# Patient Record
Sex: Female | Born: 1975 | Race: White | Hispanic: No | Marital: Married | Smoking: Never smoker
Health system: Southern US, Community
[De-identification: ages and names within clinical notes are randomized; demographics above are authoritative.]

## PROBLEM LIST (undated history)

## (undated) DIAGNOSIS — K529 Noninfective gastroenteritis and colitis, unspecified: Secondary | ICD-10-CM

## (undated) DIAGNOSIS — J45909 Unspecified asthma, uncomplicated: Secondary | ICD-10-CM

## (undated) DIAGNOSIS — J302 Other seasonal allergic rhinitis: Secondary | ICD-10-CM

## (undated) DIAGNOSIS — N83209 Unspecified ovarian cyst, unspecified side: Secondary | ICD-10-CM

## (undated) HISTORY — PX: UMBILICAL HERNIA REPAIR: SHX196

## (undated) HISTORY — DX: Noninfective gastroenteritis and colitis, unspecified: K52.9

## (undated) HISTORY — DX: Other seasonal allergic rhinitis: J30.2

## (undated) HISTORY — DX: Unspecified asthma, uncomplicated: J45.909

## (undated) HISTORY — DX: Unspecified ovarian cyst, unspecified side: N83.209

---

## 1999-07-22 ENCOUNTER — Other Ambulatory Visit: Admission: RE | Admit: 1999-07-22 | Discharge: 1999-07-22 | Payer: Self-pay | Admitting: *Deleted

## 2000-08-22 ENCOUNTER — Other Ambulatory Visit: Admission: RE | Admit: 2000-08-22 | Discharge: 2000-08-22 | Payer: Self-pay | Admitting: *Deleted

## 2001-08-15 ENCOUNTER — Other Ambulatory Visit: Admission: RE | Admit: 2001-08-15 | Discharge: 2001-08-15 | Payer: Self-pay | Admitting: Obstetrics and Gynecology

## 2003-01-05 ENCOUNTER — Other Ambulatory Visit: Admission: RE | Admit: 2003-01-05 | Discharge: 2003-01-05 | Payer: Self-pay | Admitting: Obstetrics and Gynecology

## 2003-10-11 ENCOUNTER — Emergency Department (HOSPITAL_COMMUNITY): Admission: AD | Admit: 2003-10-11 | Discharge: 2003-10-11 | Payer: Self-pay | Admitting: Family Medicine

## 2004-01-29 ENCOUNTER — Other Ambulatory Visit: Admission: RE | Admit: 2004-01-29 | Discharge: 2004-01-29 | Payer: Self-pay | Admitting: Family Medicine

## 2005-02-25 ENCOUNTER — Inpatient Hospital Stay (HOSPITAL_COMMUNITY): Admission: AD | Admit: 2005-02-25 | Discharge: 2005-02-27 | Payer: Self-pay | Admitting: Obstetrics and Gynecology

## 2005-03-03 ENCOUNTER — Encounter: Admission: RE | Admit: 2005-03-03 | Discharge: 2005-03-14 | Payer: Self-pay | Admitting: Obstetrics and Gynecology

## 2005-03-24 ENCOUNTER — Other Ambulatory Visit: Admission: RE | Admit: 2005-03-24 | Discharge: 2005-03-24 | Payer: Self-pay | Admitting: Obstetrics and Gynecology

## 2005-08-18 ENCOUNTER — Encounter: Admission: RE | Admit: 2005-08-18 | Discharge: 2005-08-18 | Payer: Self-pay | Admitting: Family Medicine

## 2005-10-15 ENCOUNTER — Emergency Department (HOSPITAL_COMMUNITY): Admission: EM | Admit: 2005-10-15 | Discharge: 2005-10-15 | Payer: Self-pay | Admitting: Family Medicine

## 2005-10-17 ENCOUNTER — Encounter: Admission: RE | Admit: 2005-10-17 | Discharge: 2005-10-17 | Payer: Self-pay | Admitting: Family Medicine

## 2006-01-29 ENCOUNTER — Ambulatory Visit: Payer: Self-pay | Admitting: Family Medicine

## 2006-03-14 ENCOUNTER — Ambulatory Visit: Payer: Self-pay | Admitting: Family Medicine

## 2006-04-24 ENCOUNTER — Ambulatory Visit: Payer: Self-pay | Admitting: Family Medicine

## 2006-09-03 ENCOUNTER — Ambulatory Visit: Payer: Self-pay | Admitting: Family Medicine

## 2007-02-26 ENCOUNTER — Inpatient Hospital Stay (HOSPITAL_COMMUNITY): Admission: AD | Admit: 2007-02-26 | Discharge: 2007-02-28 | Payer: Self-pay | Admitting: Obstetrics and Gynecology

## 2007-06-19 IMAGING — CR DG CHEST 2V
2 series · 2 of 2 positions shown · non-contrast
Comparison: none

CLINICAL DATA: Cough and fever. 
 CHEST, TWO VIEWS: 
 Comparison 10/11/03. 
 There is an infiltrate in the superior segment of the right lower lobe with some extension into the lower segment of the right lower lobe.  This is compatible with pneumonia.  There is no effusion.  The left lung is clear.

[view not recorded (1 of 2)]
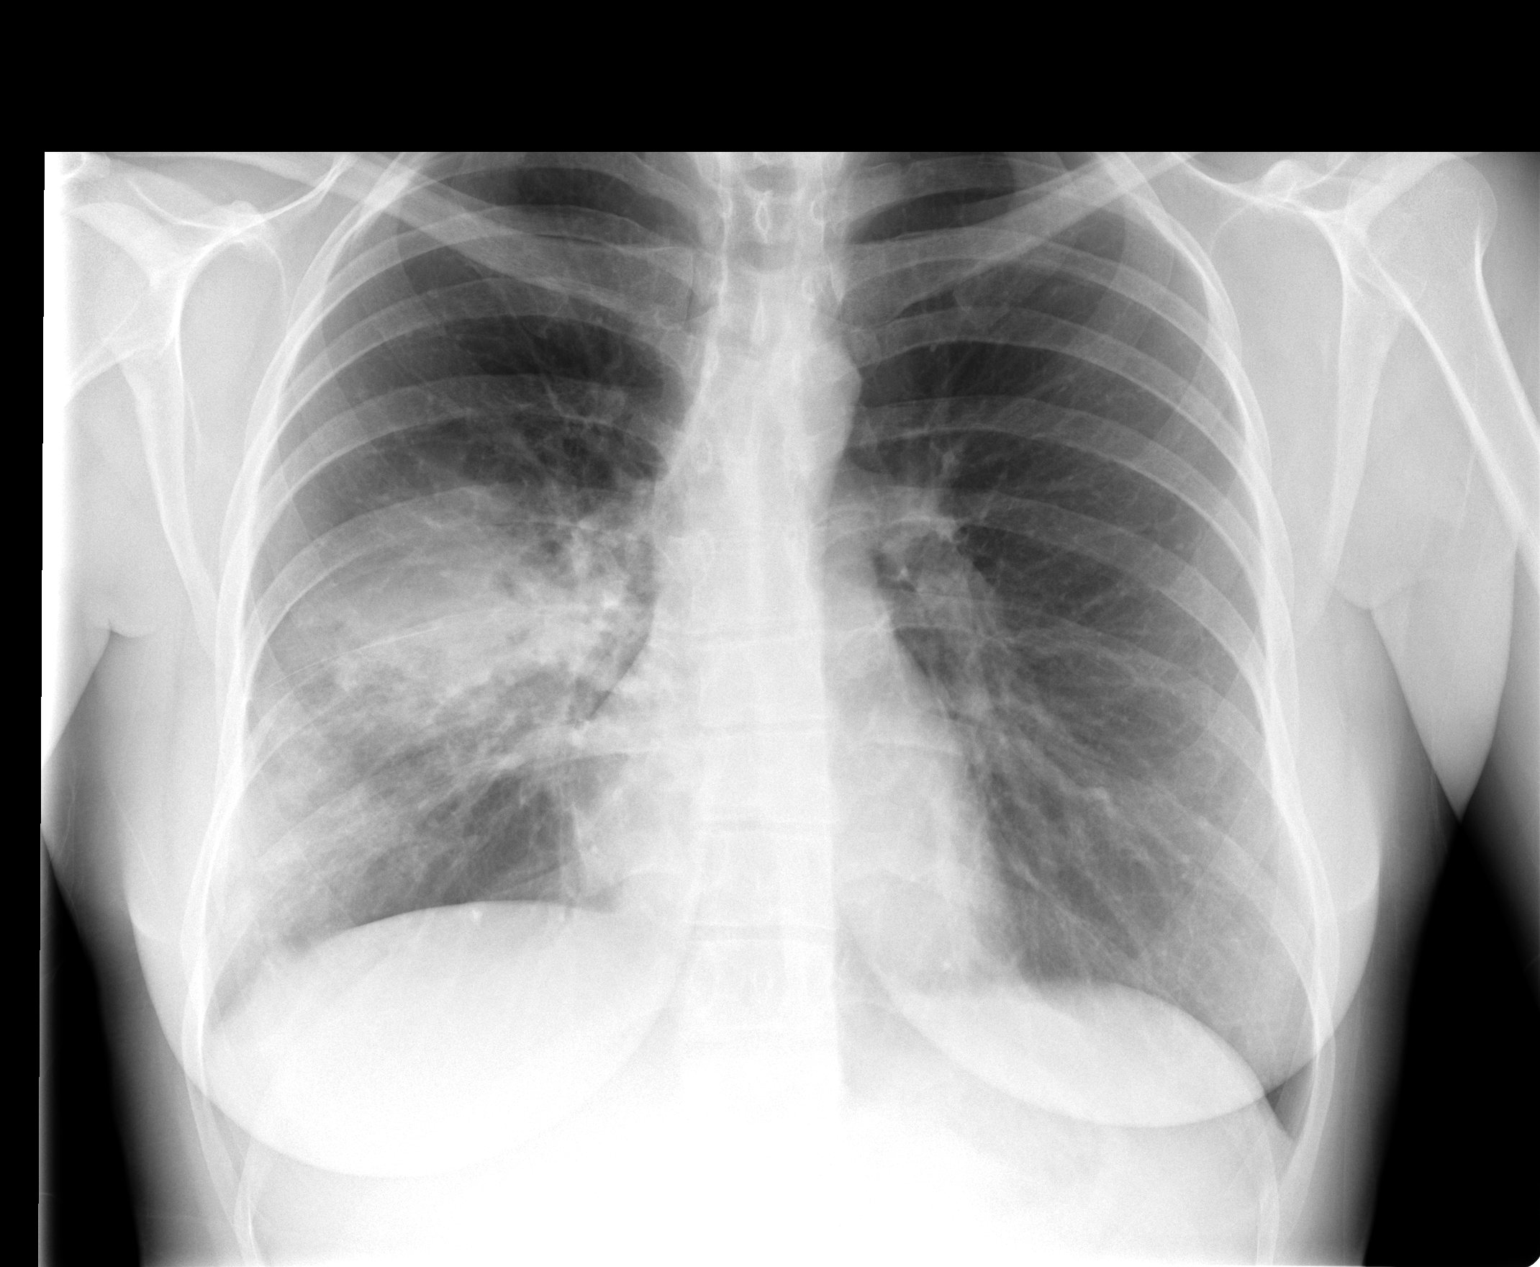

[view not recorded (2 of 2)]
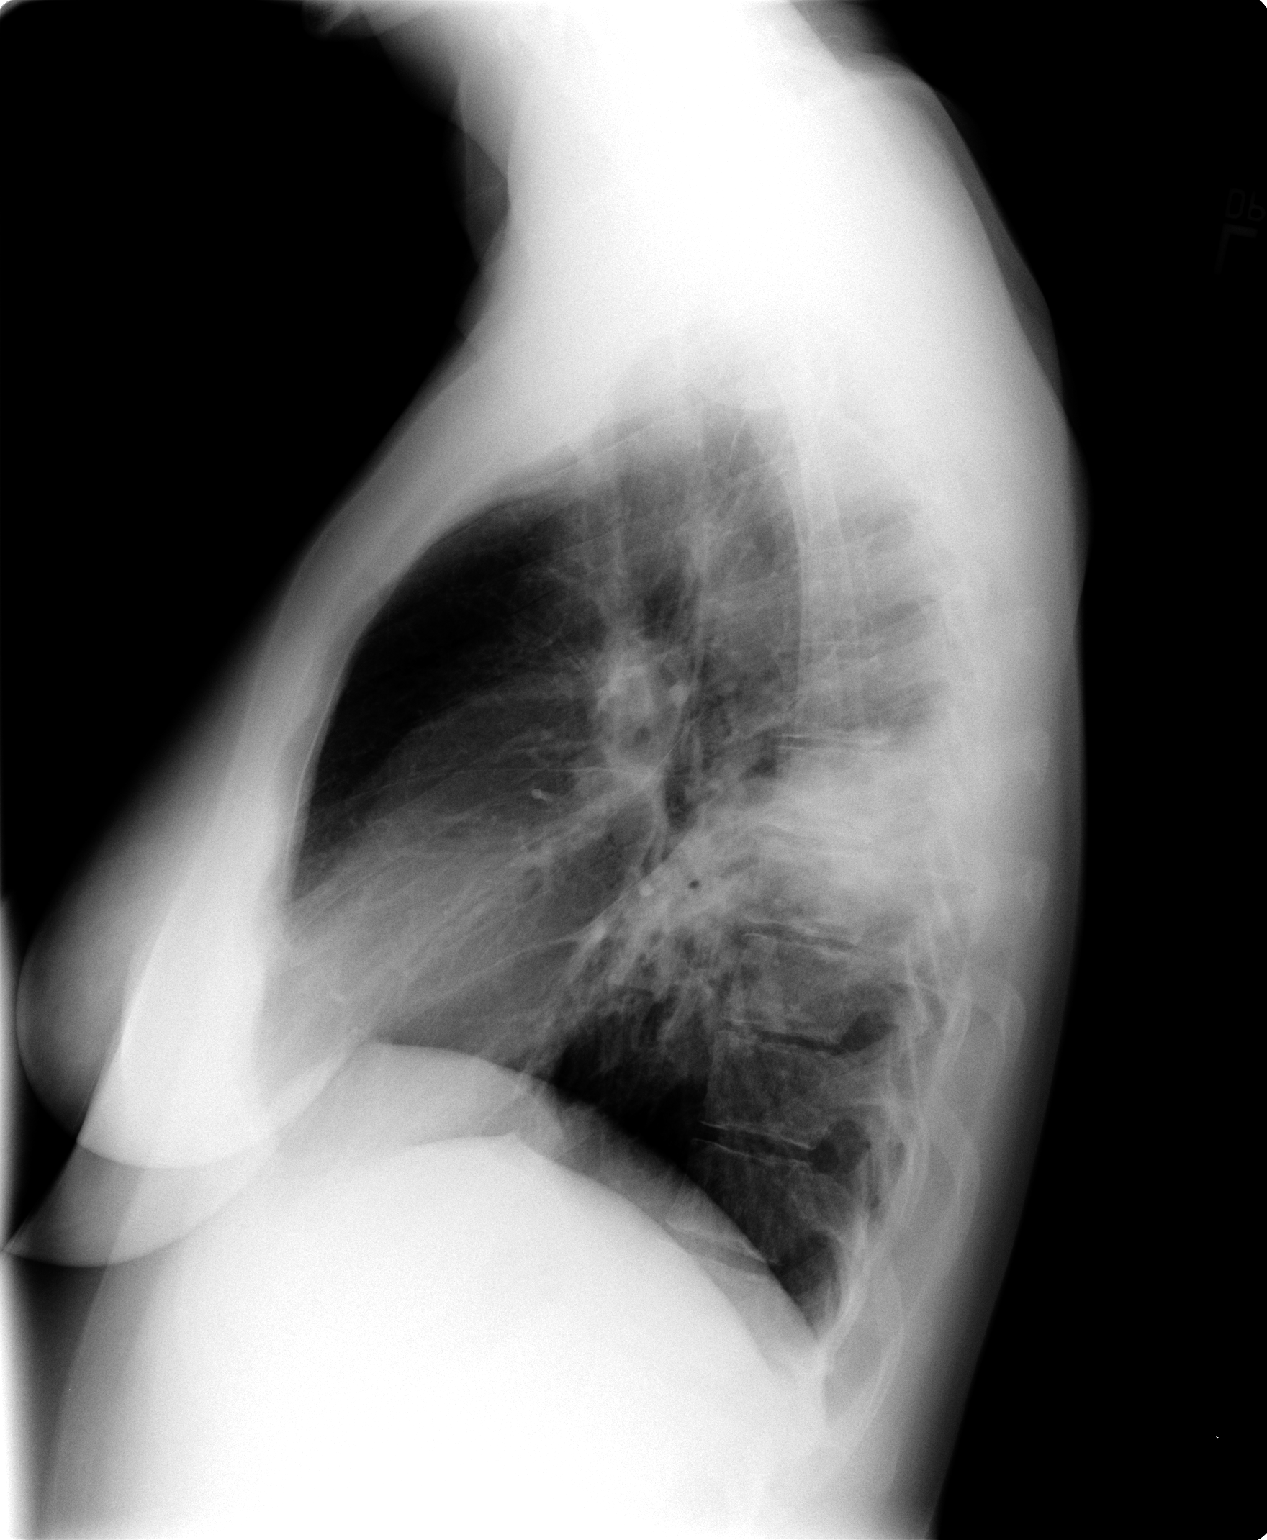

[2 of 2 positions shown; findings below may reference images not displayed]

IMPRESSION: Right lower lobe pneumonia.

## 2008-10-23 DIAGNOSIS — K529 Noninfective gastroenteritis and colitis, unspecified: Secondary | ICD-10-CM

## 2008-10-23 HISTORY — DX: Noninfective gastroenteritis and colitis, unspecified: K52.9

## 2009-08-01 ENCOUNTER — Inpatient Hospital Stay: Payer: Self-pay | Admitting: Internal Medicine

## 2009-08-01 ENCOUNTER — Encounter: Payer: Self-pay | Admitting: Family Medicine

## 2009-08-02 ENCOUNTER — Encounter: Payer: Self-pay | Admitting: Family Medicine

## 2009-08-03 ENCOUNTER — Encounter: Payer: Self-pay | Admitting: Family Medicine

## 2009-08-11 ENCOUNTER — Ambulatory Visit: Payer: Self-pay | Admitting: Family Medicine

## 2009-08-11 DIAGNOSIS — R197 Diarrhea, unspecified: Secondary | ICD-10-CM | POA: Insufficient documentation

## 2010-12-28 ENCOUNTER — Encounter: Payer: Self-pay | Admitting: Family Medicine

## 2011-02-09 ENCOUNTER — Telehealth: Payer: Self-pay | Admitting: Family Medicine

## 2011-02-09 DIAGNOSIS — Z0000 Encounter for general adult medical examination without abnormal findings: Secondary | ICD-10-CM | POA: Insufficient documentation

## 2011-02-09 NOTE — Telephone Encounter (Signed)
Message copied by Roxy Manns on Thu Feb 09, 2011  5:01 PM ------      Message from: Liane Comber      Created: Mon Feb 06, 2011  9:43 AM      Regarding: Cpx labs Mon 4/23       Please order  future cpx labs for pt's upcomming lab appt.      Thanks      Rodney Booze

## 2011-02-13 ENCOUNTER — Other Ambulatory Visit (INDEPENDENT_AMBULATORY_CARE_PROVIDER_SITE_OTHER): Payer: BC Managed Care – PPO | Admitting: Family Medicine

## 2011-02-13 DIAGNOSIS — Z0000 Encounter for general adult medical examination without abnormal findings: Secondary | ICD-10-CM

## 2011-02-13 NOTE — Telephone Encounter (Signed)
Addended by: Liane Comber on: 02/13/2011 09:15 AM   Modules accepted: Orders

## 2011-02-13 NOTE — Telephone Encounter (Signed)
Changes orders to [facility]

## 2011-02-20 ENCOUNTER — Encounter: Payer: Self-pay | Admitting: Family Medicine

## 2011-02-20 ENCOUNTER — Ambulatory Visit (INDEPENDENT_AMBULATORY_CARE_PROVIDER_SITE_OTHER): Payer: BC Managed Care – PPO | Admitting: Family Medicine

## 2011-02-20 ENCOUNTER — Telehealth: Payer: Self-pay | Admitting: Gastroenterology

## 2011-02-20 DIAGNOSIS — Z23 Encounter for immunization: Secondary | ICD-10-CM

## 2011-02-20 DIAGNOSIS — Z0000 Encounter for general adult medical examination without abnormal findings: Secondary | ICD-10-CM

## 2011-02-20 DIAGNOSIS — R197 Diarrhea, unspecified: Secondary | ICD-10-CM

## 2011-02-20 DIAGNOSIS — N83209 Unspecified ovarian cyst, unspecified side: Secondary | ICD-10-CM

## 2011-02-20 DIAGNOSIS — B070 Plantar wart: Secondary | ICD-10-CM | POA: Insufficient documentation

## 2011-02-20 NOTE — Assessment & Plan Note (Signed)
Reviewed health habits including diet and exercise and skin cancer prevention Also reviewed health mt list, fam hx and immunizations  Good habits fam hx high chol  Pend labs from [facility]

## 2011-02-20 NOTE — Assessment & Plan Note (Signed)
2 on R foot and 1 on left gently debrided (pt declined cryo)  Will continue to tx with mediplast or compound W and f/u if not resolved

## 2011-02-20 NOTE — Assessment & Plan Note (Signed)
Recurrent/ painful/ on OC Pt pref to change to gyn only who does not do OB Will make ref

## 2011-02-20 NOTE — Assessment & Plan Note (Signed)
Unusual night time episodes of abd cramps/' severe diarrhea/ nausea and pre syncope ? Food intol or other  Consider inflam bd Had infection in the past Ref to GI

## 2011-02-20 NOTE — Patient Instructions (Signed)
We will update you when labs return  Tdap vaccine today  We will refer you to GI and GYN at check out  Avoid red meat/ fried foods/ egg yolks/ fatty breakfast meats/ butter, cheese and high fat dairy/ and shellfish

## 2011-02-20 NOTE — Progress Notes (Signed)
Subjective:    Patient ID: Christina Briggs, female    DOB: [birth year], 35 y.o.   MRN: [id number]  HPI Here for health mt exam and to review chronic medical problems and plantar warts and ovarian cysts with pain and new episodes of night time diarrhea and pre syncope  Has had a lot going on lately   Thinks she has some plantar warts - first used a patch - not improved- then used the freezing  Not usually painful  L foot has a cluster - notices it more    Gyn -- Dr Christina Briggs - Done having children  Has ovarian cysts -and needs gyn that is not OB Recurrent on L side  Constant -- last one was 8 mo ago  Tired of OC and husb had vasectomy - and she wants to come off of it (on ortho cyclen right now) -- monophasic  mirina iud did not work out  Pap was 6 mo ago -- normal in September   Menses-- pretty regular on OC -- not really heavy but does have a lot of cramps  More moodiness before menses as well - as she gets older    Wt is up 2 lb with bmiof 27 Is working on stabilizing that   Td-- thinks it was many years ago  Long overdue  Is ok with Tdap  Is eating a healthy diet  Lost 12 lb since February -- no regular exercise   [facility] drawn last Monday the 23rd  Wakes up in the middle of the night with overwhelming nausea and diarrhea and cramps and then almost passes out No blood since episode several years ago  No vomiting  5-6 times per year  ? What could this be   Has several moles --many - and she gets scars when they are removed  Sees Dr Christina Briggs  ? Is there a way to remove moles without scars?   Past Medical History  Diagnosis Date  . Bloody diarrhea 07/2009    admitted to [facility], thickened colon on CT   No past surgical history on file.  reports that she has never smoked. She does not have any smokeless tobacco history on file. She reports that she drinks alcohol. Her drug history not on file. family history is not on file. Allergies  Allergen  Reactions  . Other Itching    Xray dye caused itching in throat.  . Sulfonamide Derivatives     REACTION: rash          Review of Systems  Review of Systems  Constitutional: Negative for fever, appetite change,  and unexpected weight change.  Eyes: Negative for pain and visual disturbance.  Respiratory: Negative for cough and shortness of breath.   Cardiovascular: Negative.  - no cp or edema  Gastrointestinal: Negative for hematemesis / jaundice  Genitourinary: Negative for urgency and frequency.  Skin: Negative for pallor. ir rash, pos for warts  Neurological: Negative for weakness, light-headedness, numbness and headaches.  Hematological: Negative for adenopathy. Does not bruise/bleed easily.  Psychiatric/Behavioral: Negative for dysphoric mood. The patient is not nervous/anxious.          Objective:   Physical Exam  Constitutional: She appears well-developed and well-nourished. No distress.  HENT:  Head: Normocephalic and atraumatic.  Right Ear: External ear normal.  Left Ear: External ear normal.  Nose: Nose normal.  Mouth/Throat: Oropharynx is clear and moist.  Eyes: Conjunctivae and EOM are normal. Pupils are equal, round, and reactive  to light.  Neck: Normal range of motion. Neck supple. No JVD present. Carotid bruit is not present. No thyromegaly present.  Pulmonary/Chest: Effort normal and breath sounds normal. No respiratory distress. She has no wheezes.  Abdominal: Soft. Bowel sounds are normal. She exhibits no distension, no abdominal bruit and no mass. There is no tenderness.       Very mild suprapubic tenderness without rebound or gaurding   Musculoskeletal: Normal range of motion. She exhibits no edema and no tenderness.  Lymphadenopathy:    She has no cervical adenopathy.  Neurological: She is alert. She has normal reflexes. No cranial nerve deficit. Coordination normal.  Skin: Skin is warm and dry. No rash noted. No erythema. No pallor.       Cluster  of 3 plantar warts on bottom of L foot and 1 on the R Areas were sterilly debrided of dead tissue Pt was not interested in cryotx   Psychiatric: She has a normal mood and affect. Her behavior is normal.          Assessment & Plan:

## 2011-02-21 ENCOUNTER — Encounter: Payer: Self-pay | Admitting: Family Medicine

## 2011-02-21 NOTE — Telephone Encounter (Signed)
Pt has had several episodes of night time nausea and diarrhea with abd cramps.  Dr Milinda Antis request pt be seen sooner than first available with MD.  She has no GI history

## 2011-02-21 NOTE — Telephone Encounter (Signed)
Left message on machine to call back  

## 2011-03-01 ENCOUNTER — Encounter: Payer: Self-pay | Admitting: Nurse Practitioner

## 2011-03-01 ENCOUNTER — Ambulatory Visit (INDEPENDENT_AMBULATORY_CARE_PROVIDER_SITE_OTHER): Payer: BC Managed Care – PPO | Admitting: Nurse Practitioner

## 2011-03-01 VITALS — BP 110/66 | HR 70 | Ht 66.0 in | Wt 169.0 lb

## 2011-03-01 DIAGNOSIS — N83209 Unspecified ovarian cyst, unspecified side: Secondary | ICD-10-CM

## 2011-03-01 DIAGNOSIS — R194 Change in bowel habit: Secondary | ICD-10-CM

## 2011-03-01 DIAGNOSIS — R198 Other specified symptoms and signs involving the digestive system and abdomen: Secondary | ICD-10-CM

## 2011-03-01 DIAGNOSIS — R1032 Left lower quadrant pain: Secondary | ICD-10-CM

## 2011-03-01 MED ORDER — HYOSCYAMINE SULFATE 0.125 MG SL SUBL: 0.1250 mg | SUBLINGUAL_TABLET | SUBLINGUAL | Status: DC | PRN

## 2011-03-01 MED ORDER — PEG-KCL-NACL-NASULF-NA ASC-C 100 G PO SOLR: ORAL | Status: DC

## 2011-03-01 NOTE — Progress Notes (Signed)
03/01/2011 Christina Briggs [id number] 09-26-1976   History of Present Illness:  Patient is a 35 year old female, new to this practice, referred by Dr. Milinda Antis for history of abdominal pain and diarrhea. In 2010 the patient was hospitalized at [facility] for abdominal pain, bloody diarrhea. CT scan with contrast showed colitis. Apparently the etiology of colitis was not determined. Since hospital discharge the patient has had a at least 5 or 6 recurrent episodes of severe lower abdominal pain associated with nausea and nonbloody diarrhea. All of the episodes are nocturnal. Each episode usually begins with severe nausea followed by abdominal pain and diarrhea. The severe LLQ  "twisting" pain occurs just during defecation and resolves immediately afterwards. Patient may have 3-4 loose bowel movements within a several hour period.  These episodes are sporadic. She had a episode in late January and another one last week. Pain is often so severe it causes her to fell faint. In between these episodes patient feels completely well with normal bowel movements. Her weight is stable. No fevers. No other GI complaints. She admits to anxiety and nearly passing out in 2003 after receiving an injection.    Past Medical History  Diagnosis Date  . Seasonal allergies   . Ovarian cyst    Past Surgical History  Procedure Date  . Umbilical hernia repair     35 years old    reports that she has never smoked. She does not have any smokeless tobacco history on file. She reports that she drinks alcohol. Her drug history not on file. family history includes Pancreatic cancer in an unspecified family member. Allergies  Allergen Reactions  . Iohexol   . Other Itching    Xray dye caused itching in throat.  . Sulfa Antibiotics   . Sulfonamide Derivatives     REACTION: rash      Outpatient Encounter Prescriptions as of 03/01/2011  Medication Sig Dispense Refill  . Fexofenadine HCl (ALLEGRA PO) OTC  as directed.       . fluticasone (VERAMYST) 27.5 MCG/SPRAY nasal spray 2 sprays by Nasal route daily.        . norgestimate-ethinyl estradiol (ORTHO-CYCLEN) 0.25-35 MG-MCG per tablet Take 1 tablet by mouth daily.           ROS: All other systems reviewed and negative except where noted in the History of Present Illness.   Physical Exam: General: Well developed, white female in no acute distress Head: Normocephalic and atraumatic Eyes:  sclerae anicteric,conjunctive pink. Ears: Normal auditory acuity Mouth: No deformity or lesions Neck: Supple, no masses.  Lungs: Clear throughout to auscultation Heart: Regular rate and rhythm; no murmurs heard Abdomen: Soft, non tender and non distended. No masses or hepatomegaly noted. Normal Bowel sounds Rectal: Not done Musculoskeletal: Symmetrical with no gross deformities  Skin: No lesions on visible extremities Extremities: No edema or deformities noted Neurological: Alert oriented. No gross neurological deficits. Cervical Nodes:  No significant cervical adenopathy Psychological:  Alert and cooperative. Normal mood and affect  Assessment and Plan: 1.  Lower abdominal pain associated with nausea and diarrhea: Two-year history of intermittent nausea, diarrhea and severe lower abdominal pain. At the time symptoms first began in 2010 patient was hospitalized with abdominal pain and bloody diarrhea. CT scan at that time showed diffuse thickening of the colon, predominantly from the splenic flexure down to the rectosigmoid region but some thickening was seen in the transverse colon as well. White count time was normal at 5.8.  Though patient's stools  have not contained any blood since, her symptoms are otherwise similar.  She could of had ischemic colitis (birth control pills, decongestants as a possible risk factors) or some sort of infectious colitis.  It seems unlikely the patient has inflammatory bowel disease given that she is asymptomatic in between  these episodes. Having said that, something is causing these ongoing episodes. Possibilities include, but are not limited to, irritable bowel syndrome, ovarian cysts (not sure how diarrhea would fit that picture however), and inflammatory bowel disease.  For further evaluation the patient will be scheduled for colonoscopy. The risks, benefits, and alternatives to colonoscopy with possible biopsy and possible polypectomy were discussed with the patient and she consents to proceed.  Trial of sublingual Hyoscyamine as needed.   2. Ovarian Cysts: CT scan October 2010 showed cyst in the left adnexa adjacent to the sigmoid colon. One cyst was 3.7 cm and another 4.7 cm. The patient takes birth control for management of cysts.

## 2011-03-01 NOTE — Patient Instructions (Signed)
We have scheduled the Colonoscopy with Dr. Stan Head. Directions and brochure provided. We sent the prescriptions for the colonoscopy prep and the Levsin tablets to your pharmacy.

## 2011-03-02 NOTE — Progress Notes (Signed)
Agree with assessment and plan as per Ms. Guenther's note.  Iva Boop, MD, Clementeen Graham

## 2011-03-08 ENCOUNTER — Telehealth: Payer: Self-pay

## 2011-03-08 NOTE — Telephone Encounter (Signed)
Patient notified as instructed by telephone.  Pt said she feels tired a lot and wonders if her hgb and RBC count even though it is in the low normal range should be of concern? Please advise.

## 2011-03-09 NOTE — Telephone Encounter (Signed)
Patient notified as instructed by telephone. Pt will call back for appt if needed. Pt also said scheduled end of May for colonoscopy with GI dr and will have results sent to Dr Milinda Antis.

## 2011-03-09 NOTE — Telephone Encounter (Signed)
I really do not think that is the cause of her fatigue F/u for fatigue if it does not improve

## 2011-03-10 NOTE — Assessment & Plan Note (Signed)
[facility]                                   ON-CALL NOTE   Christina Briggs, Christina Briggs                      MRN:          [id number]  DATE:09/01/2006                            DOB:          Nov 15, 1975    CALL TIME:  5:04 P.M.   TELEPHONE NUMBER:  [phone].   CALL MESSAGE:  The patient complains of symptoms of pink eye bilaterally. He  son has had it for the past 3 days and has responded to treatment. Now  today, she has similar symptoms of redness, burning in both eyes, and yellow-  green discharge from both eyes. She has no other symptoms. She does not wear  contact lenses. My response is that it does indeed sound like  conjunctivitis. I called in Tobramycin ophthalmic drops, to use 2 drops  b.i.d. until clear. To dispense 10 ml with no refills, to [facility] at [phone].    ______________________________  Tera Mater. Clent Ridges, MD    SAF/MedQ  DD: 09/01/2006  DT: 09/01/2006  Job #: [id number]   cc:   Marne A. Milinda Antis, MD

## 2011-03-10 NOTE — Discharge Summary (Signed)
Christina Briggs, CRANFIELD             ACCOUNT NO.:  [id number]   MEDICAL RECORD NO.:  [id number]          PATIENT TYPE:  INP   LOCATION:  9113                          FACILITY:  [facility]   PHYSICIAN:  Gerrit Friends. Aldona Bar, M.D.   DATE OF BIRTH:  [birth year]   DATE OF ADMISSION:  02/26/2007  DATE OF DISCHARGE:  02/28/2007                               DISCHARGE SUMMARY   DISCHARGE DIAGNOSES:  1. Term pregnancy, delivered 8-pound 6-ounce female infant, Apgars 9 and      10.  2. Blood type A negative - RhoGAM not needed.  Baby Rh negative.   PROCEDURES:  1. Induction of labor.  2. Normal spontaneous delivery.  3. Second-degree tear and repair.   SUMMARY:  This is 35 year old gravida 3, para 1 was admitted at 39 weeks  plus for induction.  At the time of admission, her cervix was 2 cm  dilated, 60% effaced with the vertex at -2 station.  Amniotomy was  carried out with production of clear fluid, and she delivered without  difficulty  a female infant weighing 8 pounds 6 ounces with Apgars of 9  and 10 over a second-degree tear which was repaired without difficulty.  Mother's postpartum course was benign.  Although she was Rh negative, it  was discovered the baby was Rh negative also, and RhoGAM was not given.  On the morning of May 8, her hemoglobin was 10.6 with a white count of  12,300 and a platelet count 275,000.  Baby was circumcised in the  morning of May 8, and mother was desirous of early discharge, and  accordingly, both she and the baby were subsequently discharged as well  on the afternoon of May 8.   Mother was given all appropriate instructions at the time of discharge  and understood all instructions well.  Discharge medications include  vitamins - one a day as long she is breast-feeding, Feosol capsules one  3 to 4 times a week, Motrin 600 mg every 6 hours as needed for cramping  or pain, and Tylox 1 to 2 every 4 to 6 hours as needed for more severe  pain.  She will return to the  office for follow-up in approximately 4  weeks' time or as needed.   CONDITION ON DISCHARGE:  Improved.      Gerrit Friends. Aldona Bar, M.D.  Electronically Signed     RMW/MEDQ  D:  02/28/2007  T:  02/28/2007  Job:  [id number]

## 2011-03-21 ENCOUNTER — Telehealth: Payer: Self-pay | Admitting: Internal Medicine

## 2011-03-21 ENCOUNTER — Telehealth: Payer: Self-pay | Admitting: *Deleted

## 2011-03-21 NOTE — Telephone Encounter (Signed)
Patient nervous about procedure and sedation. Answered her questions. Advised her to call us if she needs anything else.

## 2011-03-22 ENCOUNTER — Encounter: Payer: Self-pay | Admitting: Internal Medicine

## 2011-03-22 ENCOUNTER — Ambulatory Visit (AMBULATORY_SURGERY_CENTER): Payer: BC Managed Care – PPO | Admitting: Internal Medicine

## 2011-03-22 VITALS — BP 124/69 | HR 86 | Temp 99.3°F | Resp 14 | Ht 67.2 in | Wt 169.0 lb

## 2011-03-22 DIAGNOSIS — R1032 Left lower quadrant pain: Secondary | ICD-10-CM

## 2011-03-22 DIAGNOSIS — R197 Diarrhea, unspecified: Secondary | ICD-10-CM

## 2011-03-22 HISTORY — PX: COLONOSCOPY: SHX174

## 2011-03-22 MED ORDER — SODIUM CHLORIDE 0.9 % IV SOLN: 500.0000 mL | INTRAVENOUS | Status: DC

## 2011-03-22 NOTE — Patient Instructions (Addendum)
Please use the hyoscyamine if you get another attack. Based upon the available information the diagnosis is most compatible with Irritable Bowel Syndrome. Please call Dr. Marvell Fuller office to arrange a follow-up appointment in June or July to discuss further and reassess. Discharged instructions given with verbal understanding. Resume previous medications.

## 2011-03-23 ENCOUNTER — Telehealth: Payer: Self-pay | Admitting: *Deleted

## 2011-03-23 NOTE — Telephone Encounter (Signed)
No answer, message left on identifier phone.

## 2011-04-24 ENCOUNTER — Encounter (HOSPITAL_COMMUNITY)
Admission: RE | Admit: 2011-04-24 | Discharge: 2011-04-24 | Disposition: A | Discharge status: Home / Self Care | Payer: BC Managed Care – PPO | Source: Ambulatory Visit | Attending: Obstetrics and Gynecology | Admitting: Obstetrics and Gynecology

## 2011-04-24 ENCOUNTER — Encounter (HOSPITAL_COMMUNITY): Payer: Self-pay

## 2011-04-24 LAB — CBC
Hemoglobin: 12.3 g/dL (ref 12.0–15.0)
MCHC: 33.7 g/dL (ref 30.0–36.0)
RBC: 4.03 MIL/uL (ref 3.87–5.11)
WBC: 5.8 10*3/uL (ref 4.0–10.5)

## 2011-04-24 LAB — COMPREHENSIVE METABOLIC PANEL
ALT: 11 U/L (ref 0–35)
Alkaline Phosphatase: 57 U/L (ref 39–117)
CO2: 27 mEq/L (ref 19–32)
Chloride: 100 mEq/L (ref 96–112)
GFR calc Af Amer: >60 mL/min (ref >60)
Glucose, Bld: 87 mg/dL (ref 70–99)
Potassium: 4 mEq/L (ref 3.5–5.1)
Sodium: 136 mEq/L (ref 135–145)
Total Protein: 7 g/dL (ref 6.0–8.3)

## 2011-04-24 NOTE — Patient Instructions (Signed)
20 Christina Briggs  04/24/2011   Your procedure is scheduled on: 05/02/11   Report to [facility] at715 am   Remember:  Do not eat food:After Midnight.  Do not drink clear liquids: After Midnight.  Take these medicines the morning of surgery with A SIP OF WATERnone  Do not wear jewelry, make-up or nail polish.  Do not bring valuables to the hospital.  Contacts, dentures or bridgework may not be worn into surgery.  Leave suitcase in the car. After surgery it may be brought to your room.  For patients admitted to the hospital, checkout time is 11:00 AM the day of discharge.   Patients discharged the day of surgery will not be allowed to drive home.  Name and phone number of your driver: spouse   Special Instructions: N/A   Please read over the following fact sheets that you were given: Pain Booklet and Surgical Site Infection Prevention

## 2011-04-24 NOTE — Patient Instructions (Signed)
20 Christina Briggs  04/24/2011   Your procedure is scheduled on: 04/24/11  Report to [facility] at 0730 AM.  Call this number if you have problems the morning of surgery: [phone]   Remember:   Do not eat food:after MN.  Do not drink clear liquids: after MN  Take these medicines the morning of surgery with A SIP OF WATER: none   Do not wear jewelry, make-up or nail polish.  Do not bring valuables to the hospital.  Contacts, dentures or bridgework may not be worn into surgery.  Leave suitcase in the car. After surgery it may be brought to your room.  For patients admitted to the hospital, checkout time is 11:00 AM the day of discharge.   Patients discharged the day of surgery will not be allowed to drive home.  Name and phone number of your driver: spouse  Special Instructions: N/A   Please read over the following fact sheets that you were given: Pain Booklet and Surgical Site Infection Prevention

## 2011-05-01 ENCOUNTER — Other Ambulatory Visit: Payer: Self-pay | Admitting: Obstetrics and Gynecology

## 2011-05-01 MED ORDER — DEXTROSE 5 % IV SOLN
1.0000 g | Freq: Once | INTRAVENOUS | Status: DC
  Filled 2011-05-01: qty 1

## 2011-05-01 MED ORDER — DEXTROSE 5 % IV SOLN
1.0000 g | Freq: Once | INTRAVENOUS | Status: AC
  Administered 2011-05-02: 1 g via INTRAVENOUS

## 2011-05-01 NOTE — H&P (Signed)
Christina Briggs is an 35 y.o. female. with a 2 year history of left ovarian cyst, monitored with ultrasound.  Most recent USG showed a 5 cm left clear smooth cyst, and a 1.4 cm similarly smooth and clear cyst on the right.  Here for laparoscopic bilateral ovarian cystectomies, with possible left S&O.  Pertinent Gynecological History: Menses: {usually regular, but often with dysmen Bleeding: regular cyclic Contraception: sprintic DES exposure: denies Blood transfusions: none Sexually transmitted diseases: no past history Previous GYN Procedures: none  Last mammogram: none Last pap: normal Date:August 2011 nornal OB History: G3, P2  Menstrual History: Menarche age: 56 No LMP recorded.    Past Medical History  Diagnosis Date  . Seasonal allergies   . Ovarian cyst   . Colitis 2010    left colon thickened on CT, suspect ischemic colitis    Past Surgical History  Procedure Date  . Umbilical hernia repair     35 years old  . Colonoscopy 03/22/11    normal    Family History  Problem Relation Age of Onset  . Pancreatic cancer      Social History:  reports that she has never smoked. She does not have any smokeless tobacco history on file. She reports that she drinks alcohol. Her drug history not on file.  Allergies:  Allergies  Allergen Reactions  . Iohexol   . Other Itching    Xray dye caused itching in throat.  . Sulfa Antibiotics   . Sulfonamide Derivatives     REACTION: rash    No prescriptions prior to admission    Review of Systems  All other systems reviewed and are negative.   Physical Exam  Constitutional: She is oriented to person, place, and time. She appears well-developed and well-nourished.  HENT:  Head: Normocephalic and atraumatic.  Neck: Normal range of motion. Neck supple.  Cardiovascular: Normal rate and regular rhythm.   Respiratory: Effort normal and breath sounds normal.  GI: Soft. Bowel sounds are normal.  Genitourinary: Vagina normal  and uterus normal.  Musculoskeletal: Normal range of motion.  Neurological: She is alert and oriented to person, place, and time.  Skin: Skin is warm and dry.  Psychiatric: She has a normal mood and affect. Her behavior is normal.     Assessment/Plan:  35 year old MWF with persistent bilat ovarian cysts, for laparoscopic removal.   ROMINE,CYNTHIA P 05/01/2011, 2:48 PM

## 2011-05-02 ENCOUNTER — Encounter (HOSPITAL_COMMUNITY): Payer: Self-pay | Admitting: Anesthesiology

## 2011-05-02 ENCOUNTER — Other Ambulatory Visit: Payer: Self-pay | Admitting: Obstetrics and Gynecology

## 2011-05-02 ENCOUNTER — Ambulatory Visit (HOSPITAL_COMMUNITY)
Admission: RE | Admit: 2011-05-02 | Discharge: 2011-05-02 | Disposition: A | Discharge status: Home / Self Care | Payer: BC Managed Care – PPO | Source: Ambulatory Visit | Attending: Obstetrics and Gynecology | Admitting: Obstetrics and Gynecology

## 2011-05-02 ENCOUNTER — Ambulatory Visit (HOSPITAL_COMMUNITY): Payer: BC Managed Care – PPO | Admitting: Anesthesiology

## 2011-05-02 ENCOUNTER — Encounter (HOSPITAL_COMMUNITY)
Admission: RE | Disposition: A | Discharge status: Home / Self Care | Payer: Self-pay | Source: Ambulatory Visit | Attending: Obstetrics and Gynecology

## 2011-05-02 DIAGNOSIS — Z01812 Encounter for preprocedural laboratory examination: Secondary | ICD-10-CM | POA: Insufficient documentation

## 2011-05-02 DIAGNOSIS — N83209 Unspecified ovarian cyst, unspecified side: Secondary | ICD-10-CM | POA: Insufficient documentation

## 2011-05-02 DIAGNOSIS — Z01818 Encounter for other preprocedural examination: Secondary | ICD-10-CM | POA: Insufficient documentation

## 2011-05-02 HISTORY — PX: LAPAROSCOPY: SHX197

## 2011-05-02 SURGERY — LAPAROSCOPY OPERATIVE
Anesthesia: General | Site: Abdomen | Laterality: Bilateral | Wound class: Clean Contaminated

## 2011-05-02 MED ORDER — GLYCOPYRROLATE 0.2 MG/ML IJ SOLN
INTRAMUSCULAR | Status: DC | PRN
  Administered 2011-05-02: .8 mg via INTRAVENOUS

## 2011-05-02 MED ORDER — MORPHINE SULFATE 2 MG/ML IJ SOLN
2.0000 mg | INTRAMUSCULAR | Status: DC | PRN
  Filled 2011-05-02: qty 1

## 2011-05-02 MED ORDER — METOCLOPRAMIDE HCL 5 MG/ML IJ SOLN: 10.0000 mg | Freq: Once | INTRAMUSCULAR | Status: DC | PRN

## 2011-05-02 MED ORDER — NEOSTIGMINE METHYLSULFATE 1 MG/ML IJ SOLN
INTRAMUSCULAR | Status: DC | PRN
  Administered 2011-05-02: 2 mg via INTRAMUSCULAR

## 2011-05-02 MED ORDER — MEPERIDINE HCL 25 MG/ML IJ SOLN
6.2500 mg | INTRAMUSCULAR | Status: DC | PRN
  Administered 2011-05-02: 12.5 mg via INTRAVENOUS

## 2011-05-02 MED ORDER — LIDOCAINE HCL (CARDIAC) 20 MG/ML IV SOLN
INTRAVENOUS | Status: DC | PRN
  Administered 2011-05-02: 70 mg via INTRAVENOUS

## 2011-05-02 MED ORDER — MIDAZOLAM HCL 5 MG/5ML IJ SOLN
INTRAMUSCULAR | Status: DC | PRN
  Administered 2011-05-02 (×2): 1 mg via INTRAVENOUS

## 2011-05-02 MED ORDER — ROCURONIUM BROMIDE 100 MG/10ML IV SOLN
INTRAVENOUS | Status: DC | PRN
  Administered 2011-05-02: 40 mg via INTRAVENOUS

## 2011-05-02 MED ORDER — ONDANSETRON HCL 4 MG/2ML IJ SOLN
INTRAMUSCULAR | Status: DC | PRN
  Administered 2011-05-02: 4 mg via INTRAMUSCULAR

## 2011-05-02 MED ORDER — PROPOFOL 10 MG/ML IV EMUL
INTRAVENOUS | Status: DC | PRN
  Administered 2011-05-02: 200 mg via INTRAVENOUS

## 2011-05-02 MED ORDER — KETOROLAC TROMETHAMINE 30 MG/ML IJ SOLN
INTRAMUSCULAR | Status: DC | PRN
  Administered 2011-05-02: 30 mg via INTRAVENOUS

## 2011-05-02 MED ORDER — DEXAMETHASONE SODIUM PHOSPHATE 10 MG/ML IJ SOLN
INTRAMUSCULAR | Status: DC | PRN
  Administered 2011-05-02: 10 mg via INTRAVENOUS

## 2011-05-02 MED ORDER — FENTANYL CITRATE 0.05 MG/ML IJ SOLN
INTRAMUSCULAR | Status: DC | PRN
  Administered 2011-05-02 (×3): 50 ug via INTRAVENOUS
  Administered 2011-05-02: 100 ug via INTRAVENOUS

## 2011-05-02 MED ORDER — FENTANYL CITRATE 0.05 MG/ML IJ SOLN: 25.0000 ug | INTRAMUSCULAR | Status: DC | PRN

## 2011-05-02 MED ORDER — DEXTROSE IN LACTATED RINGERS 5 % IV SOLN
INTRAVENOUS | Status: DC
  Filled 2011-05-02: qty 1000

## 2011-05-02 MED ORDER — PROMETHAZINE HCL 25 MG/ML IJ SOLN
25.0000 mg | Freq: Four times a day (QID) | INTRAMUSCULAR | Status: DC | PRN
  Filled 2011-05-02: qty 1

## 2011-05-02 MED ORDER — LACTATED RINGERS IV SOLN
INTRAVENOUS | Status: DC
  Administered 2011-05-02 (×3): via INTRAVENOUS

## 2011-05-02 MED ORDER — SODIUM CHLORIDE 0.9 % IR SOLN
Status: DC | PRN
  Administered 2011-05-02: 1000 mL

## 2011-05-02 MED ORDER — OXYCODONE-ACETAMINOPHEN 5-325 MG PO TABS
1.0000 | ORAL_TABLET | ORAL | Status: DC | PRN
  Filled 2011-05-02: qty 2

## 2011-05-02 SURGICAL SUPPLY — 30 items
BENZOIN TINCTURE PRP APPL 2/3 (GAUZE/BANDAGES/DRESSINGS) ×3 IMPLANT
CABLE HIGH FREQUENCY MONO STRZ (ELECTRODE) ×3 IMPLANT
DRAPE UTILITY XL STRL (DRAPES) ×3 IMPLANT
GLOVE BIOGEL PI IND STRL 7.0 (GLOVE) ×6 IMPLANT
GLOVE BIOGEL PI INDICATOR 7.0 (GLOVE) ×3
GLOVE ECLIPSE 6.5 STRL STRAW (GLOVE) ×6 IMPLANT
GOWN BRE IMP SLV AUR LG STRL (GOWN DISPOSABLE) ×6 IMPLANT
NS IRRIG 1000ML POUR BTL (IV SOLUTION) ×3 IMPLANT
PACK LAPAROSCOPY BASIN (CUSTOM PROCEDURE TRAY) ×3 IMPLANT
POUCH SPECIMEN RETRIEVAL 10MM (ENDOMECHANICALS) ×3 IMPLANT
SCISSORS LAP 5X35 DISP (ENDOMECHANICALS) ×3 IMPLANT
SEALER TISSUE G2 CVD JAW 35 (ENDOMECHANICALS) IMPLANT
SEALER TISSUE G2 CVD JAW 45CM (ENDOMECHANICALS)
SET IRRIG TUBING LAPAROSCOPIC (IRRIGATION / IRRIGATOR) ×3 IMPLANT
SLEEVE Z-THREAD 5X100MM (TROCAR) ×6 IMPLANT
STRIP CLOSURE SKIN 1/4X4 (GAUZE/BANDAGES/DRESSINGS) ×3 IMPLANT
SUT VIC AB 3-0 PS2 18 (SUTURE) ×1
SUT VIC AB 3-0 PS2 18XBRD (SUTURE) ×2 IMPLANT
SUT VICRYL 0 ENDOLOOP (SUTURE) IMPLANT
SUT VICRYL 0 UR6 27IN ABS (SUTURE) ×6 IMPLANT
SYR 30ML LL (SYRINGE) ×3 IMPLANT
SYR 50ML LL SCALE MARK (SYRINGE) ×3 IMPLANT
SYR 5ML LL (SYRINGE) ×3 IMPLANT
TOWEL OR 17X24 6PK STRL BLUE (TOWEL DISPOSABLE) ×3 IMPLANT
TRAY FOLEY CATH 14FR (SET/KITS/TRAYS/PACK) ×3 IMPLANT
TROCAR BALLN 12MMX100 BLUNT (TROCAR) ×3 IMPLANT
TROCAR XCEL NON-BLD 11X100MML (ENDOMECHANICALS) IMPLANT
TROCAR XCEL NON-BLD 5MMX100MML (ENDOMECHANICALS) ×6 IMPLANT
WARMER LAPAROSCOPE (MISCELLANEOUS) ×3 IMPLANT
WATER STERILE IRR 1000ML POUR (IV SOLUTION) ×3 IMPLANT

## 2011-05-02 NOTE — Anesthesia Preprocedure Evaluation (Addendum)
Anesthesia Evaluation  Name, MR# and DOB Patient awake  General Assessment Comment  Reviewed: Allergy & Precautions, H&P  and Patient's Chart, lab work & pertinent test results  Airway Mallampati: I TM Distance: >3 FB Neck ROM: Full    Dental  (+) Teeth Intact   Pulmonaryneg pulmonary ROS        Cardiovascular    Neuro/PsychNegative Neurological ROS   GI/Hepatic/Renal negative GI ROS, negative Liver ROS, and negative Renal ROS (+)       Endo/Other  Negative Endocrine ROS (+)   Abdominal   Musculoskeletal negative musculoskeletal ROS (+)  Hematology negative hematology ROS (+)   Peds  Reproductive/Obstetrics          Anesthesia Physical Anesthesia Plan  ASA: I  Anesthesia Plan: General   Post-op Pain Management:    Induction: Intravenous  Airway Management Planned: Oral ETT  Additional Equipment:   Intra-op Plan:   Post-operative Plan: Extubation in OR  Informed Consent: I have reviewed the patients History and Physical, chart, labs and discussed the procedure including the risks, benefits and alternatives for the proposed anesthesia with the patient or authorized representative who has indicated his/her understanding and acceptance.   Dental advisory given  Plan Discussed with: CRNA  Anesthesia Plan Comments:         Anesthesia Quick Evaluation

## 2011-05-02 NOTE — Op Note (Addendum)
Preoperative diagnosis bilateral ovarian cysts  Postoperative diagnosis same pending  Procedure laparoscopic bilateral ovarian cystectomy  Surgeon Dr. Aram Beecham Romine Assistant Dr. Leda Quail  Anesthesia Gen. Endotracheal   Estimated blood loss minimal  Complications none  Procedure  The patient was taken to the operating room and after induction of adequate general endotracheal anesthesia was placed in the dorsolithotomy position and prepped and draped in usual fashion.  An anterior thin and posterior weighted retractor were placed. The cervix was grasped with a single-tooth tenaculum and a Hulka uterine manipulator was placed. Foley catheter was then inserted.  Attention was next turned to the abdomen. The infraumbilical crease was infiltrated with 5 cc of quarter percent plain Marcaine then incised with a knife. A Veres needle was inserted into the peritoneal space. Proper placement was tested by noting negative aspirate through the Veres needle and then free flow of normal saline through the needle again with a negative aspirate and then by noting the response of a drop of saline placed at the hub of the needle as the abdominal wall was elevated. Pneumoperitoneum was then created using the automatic insufflator using 3 L of CO2. A 10 mm Optiview trocar was then inserted into the peritoneal space and the laparoscope inserted. The abdominal wall was then transilluminated and the sites for the lower abdominal trocars selected, infiltrated with Marcaine, and incised with the knife.  The 5 mm trocars were inserted in the right and left lower quadrants under direct visualization.   The pelvis was inspected. The uterus was freely mobile in the anterior and posterior cul-de-sacs were free. The tubes were often freely mobile bilaterally. The right ovary contained a 5 cm smooth-appearing cyst and the left ovary contained a 1-1/2 cm smooth-appearing cyst.the upper abdomen was inspected and appeared  normal. The appendix liver, gallbladder, and stomach were normal.  Monopolar scissors were then inserted into the pelvis under direct visualization and the ovary was grasped with $1 million grasper and the monopolar scissors and released with cautery 2 for a hole in the central part of the 5 cm cyst. Clear fluid was exuded from the cyst. The margin of the cyst wall was grasped with a [place] grasper and the edge of the ovary was grasped with $1 million grasper and the cyst wall was peeled off the surface of the ovary and sent to pathology.  A similar procedure was done on patient's left.   The cyst was elevated, opened with the monopolar scissors, and gentle peeled off with the grasper.  The cyst wall was sent to pathology.  A small paratubal cyst of about 1 cm diameter was excised from the right tube and was removed but not sent to pathology.  Hemostasis was achieved with the monopolar scissors.    Both ovaries were irrigated with the Nezhat and hemostasis was noted.   Both ureters were seen peristalsing along the sidewall.  Interceed was placed over the operative sites of both ovaries.  The instruments were removed and the procedure was terminated.    The trocar sleeves were removed under direct visualization.  The pneumoperitoneum was allowed to escape before removing the umbilical trocal sleeve.  The fascia at the umbilicus was closed with a single suture of 0 vicryl.  The skin at all three sites was closed with 3-0 vicryl rapid, benzoin, and steri strips.  The foley catheter was removed and the procedure was terminated.  The patient was taken to the recovery room in good condition.   Sponge, needle,  and instrument counts were correct times 3.

## 2011-05-02 NOTE — Transfer of Care (Signed)
Immediate Anesthesia Transfer of Care Note  Patient: Christina Briggs  Procedure(s) Performed:  LAPAROSCOPY OPERATIVE - Bilateral ovaina cystectomy; and Removal of Right paratubal Cyst (not sent as a Specimen as per Dr. Tresa Res)  Patient Location: PACU  Anesthesia Type: General  Level of Consciousness: awake, alert  and patient cooperative  Airway & Oxygen Therapy: Patient Spontanous Breathing and Patient connected to nasal cannula oxygen  Post-op Assessment: Report given to PACU RN, Post -op Vital signs reviewed and stable and Patient moving all extremities  Post vital signs: Reviewed and stable  Complications: No apparent anesthesia complications

## 2011-05-03 NOTE — OR Nursing (Signed)
Addendum system documented erroneous time for end of surgery. Correct time now documented

## 2011-05-05 NOTE — Addendum Note (Signed)
Addendum  created 05/05/11 1054 by Randa Spike   Modules edited:Notes Section

## 2011-05-05 NOTE — Anesthesia Postprocedure Evaluation (Signed)
  Anesthesia Post-op Note  Patient: Christina Briggs  Procedure(s) Performed:  LAPAROSCOPY OPERATIVE - Bilateral ovaina cystectomy; and Removal of Right paratubal Cyst (not sent as a Specimen as per Dr. Tresa Res)  Patient Location: PACU  Anesthesia Type: General  Level of Consciousness: patient cooperative  Airway and Oxygen Therapy: Patient Spontanous Breathing  Post-op Pain: mild  Post-op Assessment: Post-op Vital signs reviewed and Patient's Cardiovascular Status Stable  Post-op Vital Signs: Reviewed and stable  Complications: No apparent anesthesia complications

## 2011-05-23 ENCOUNTER — Encounter (HOSPITAL_COMMUNITY): Payer: Self-pay | Admitting: Obstetrics and Gynecology

## 2011-06-02 IMAGING — CR DG ABDOMEN 2V
1 series · 2 of 2 positions shown · non-contrast
Comparison: none

REASON FOR EXAM: abdominal pain
COMMENTS:

[Series 1: view not recorded · 0.17mm/px · 2 of 2 slices shown]
[im 1/2]
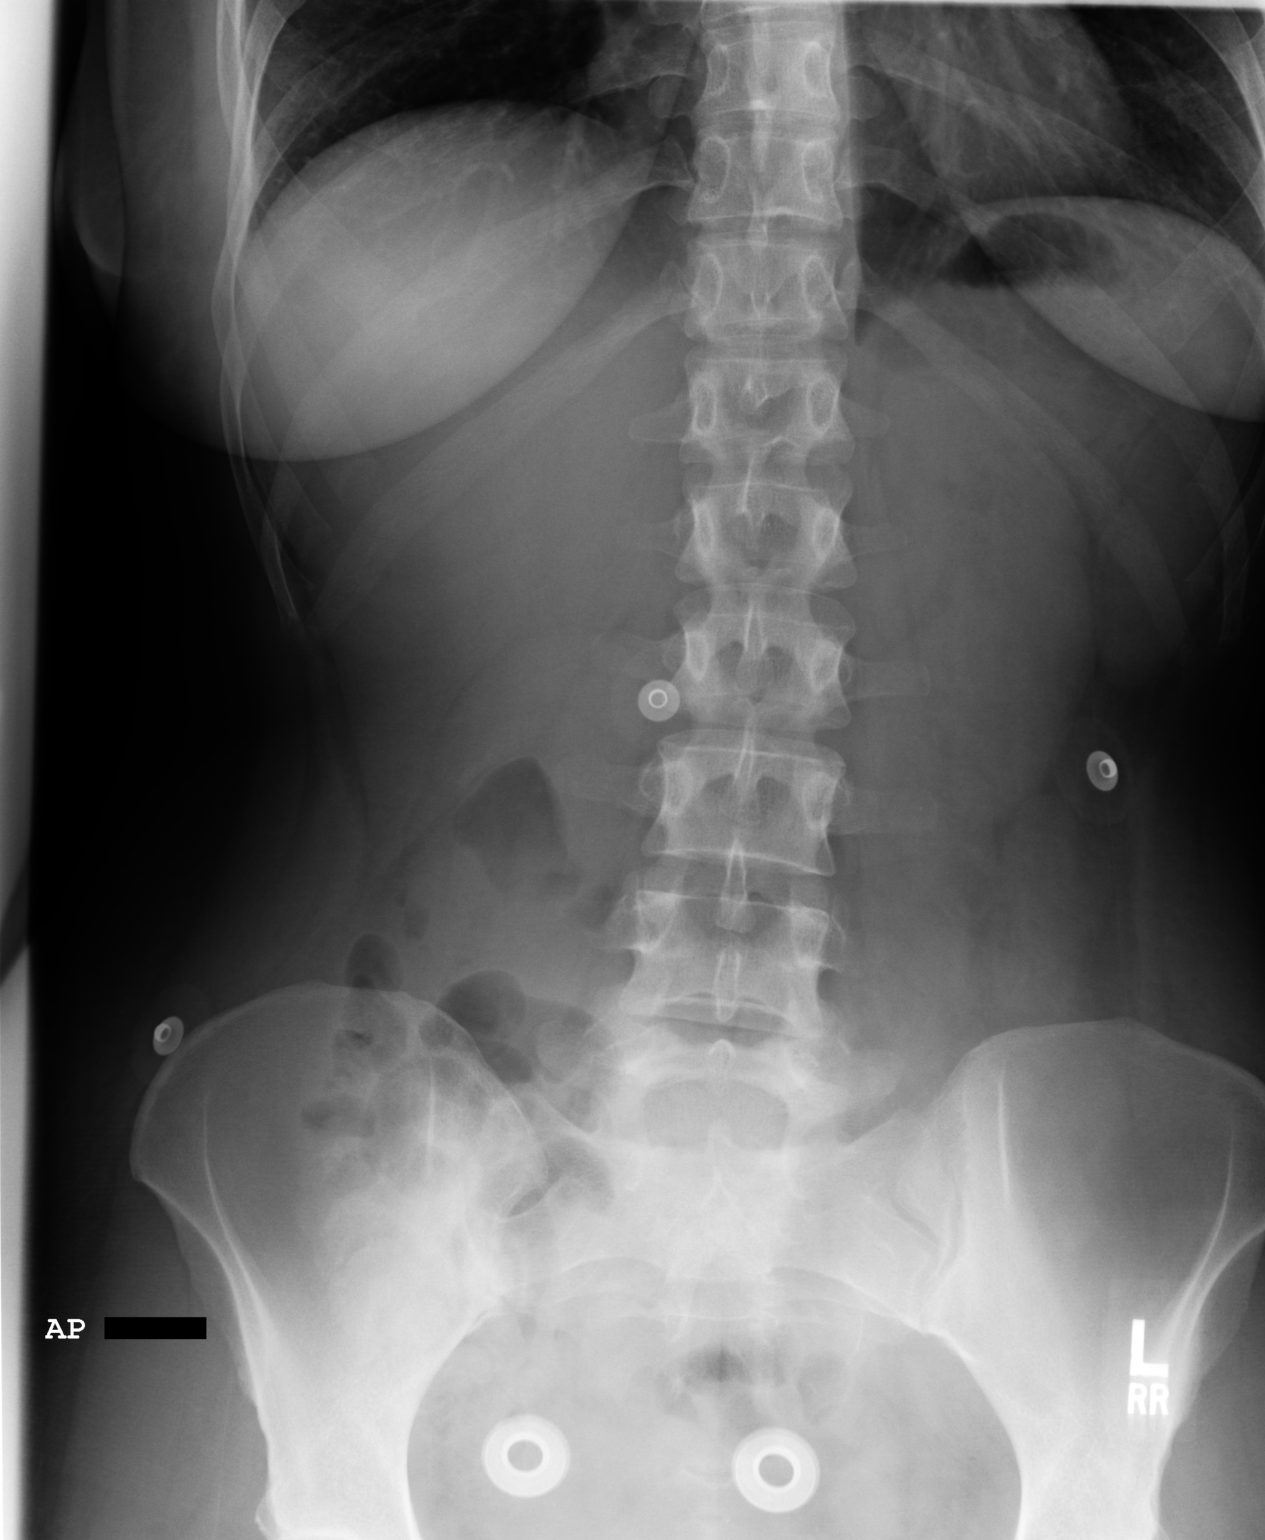
[im 2/2]
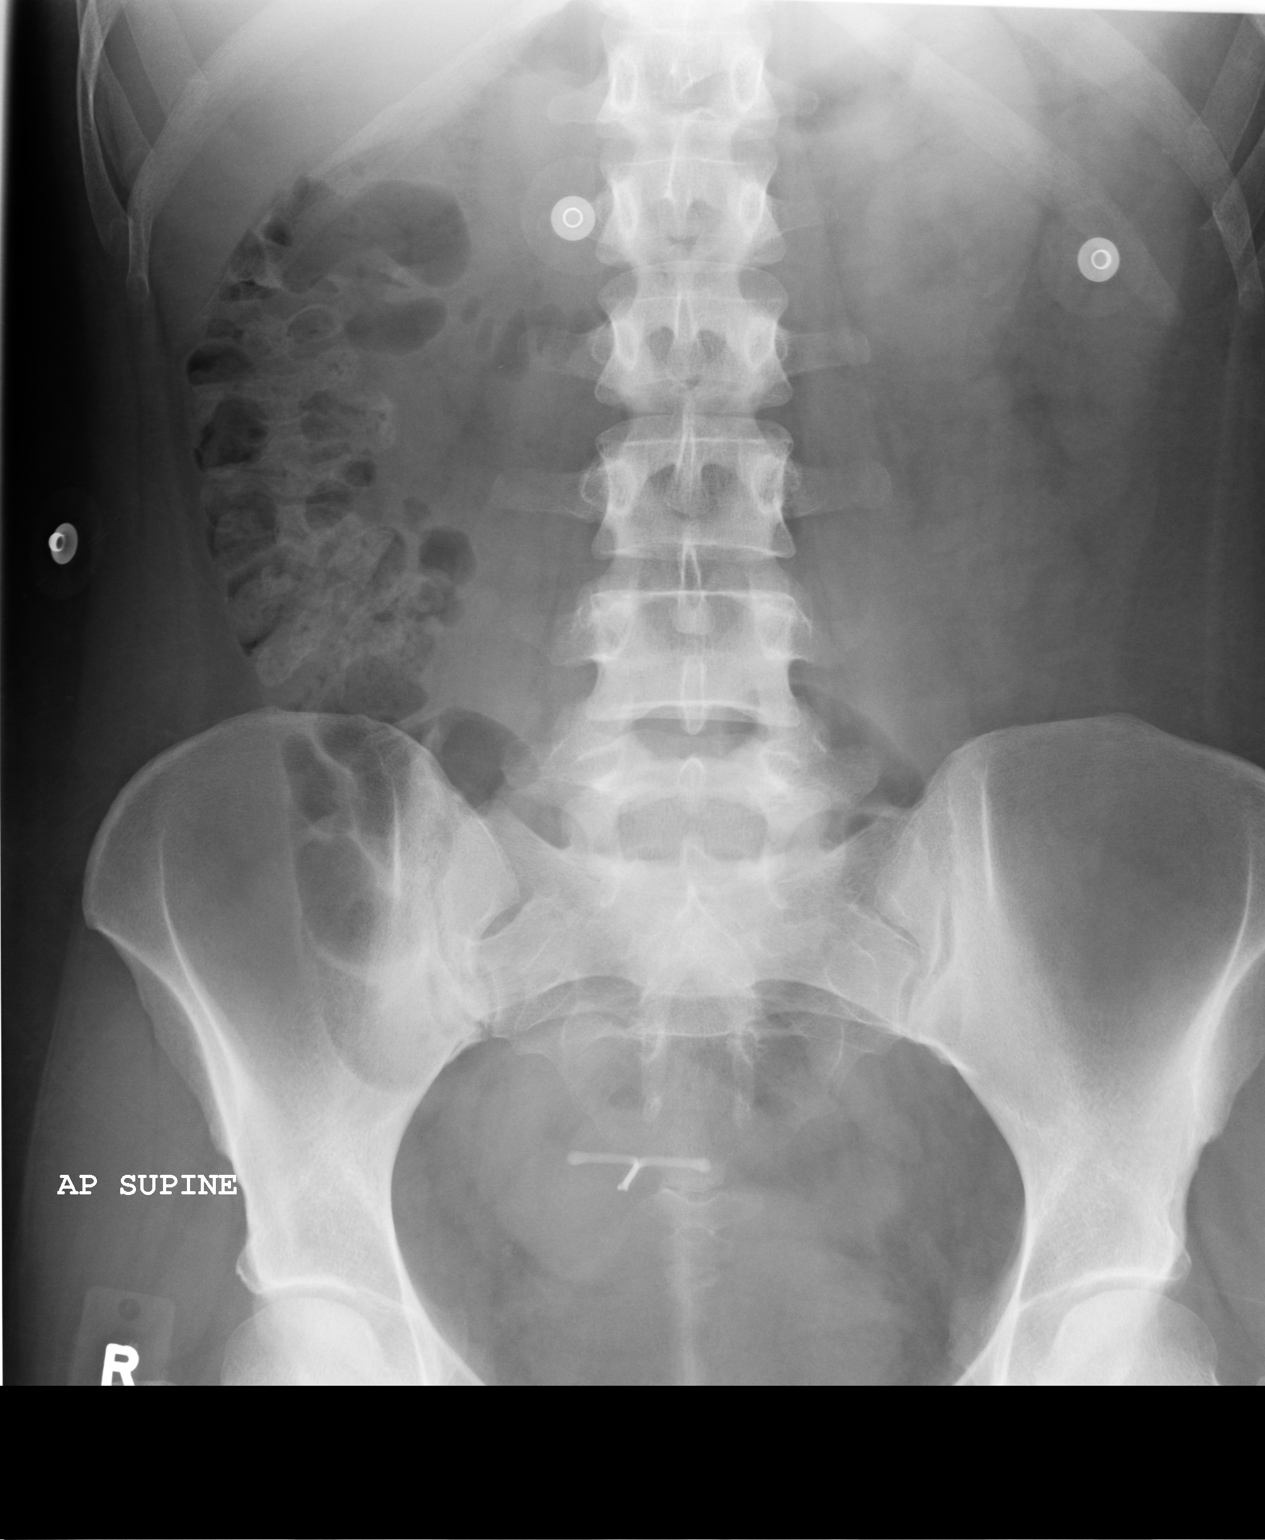

[2 of 2 positions shown; findings below may reference images not displayed]

PROCEDURE:     DXR - DXR ABDOMEN 2 V FLAT AND ERECT  - August 01, 2009  [DATE]

RESULT:     Air is seen within nondilated loops of large and small bowel. A
moderate amount stool is appreciated within the ascending colon. The
visualized bony skeleton is unremarkable. An intrauterine device is
identified within the pelvis.
IMPRESSION: Nonobstructive bowel gas pattern with a moderate amount of stool.

## 2011-08-10 NOTE — Telephone Encounter (Signed)
SEE OTHER PHONE NOTE 

## 2011-09-13 ENCOUNTER — Encounter: Payer: Self-pay | Admitting: Family Medicine

## 2013-02-04 ENCOUNTER — Other Ambulatory Visit: Payer: Self-pay | Admitting: *Deleted

## 2013-02-04 NOTE — Telephone Encounter (Signed)
Refills on file cancelled

## 2013-04-29 ENCOUNTER — Ambulatory Visit: Payer: BC Managed Care – PPO | Admitting: Family Medicine

## 2013-05-29 ENCOUNTER — Encounter: Payer: Self-pay | Admitting: Certified Nurse Midwife

## 2013-05-29 ENCOUNTER — Ambulatory Visit (INDEPENDENT_AMBULATORY_CARE_PROVIDER_SITE_OTHER): Payer: BC Managed Care – PPO | Admitting: Certified Nurse Midwife

## 2013-05-29 VITALS — BP 104/60 | HR 74 | Resp 16 | Ht 66.25 in | Wt 184.0 lb

## 2013-05-29 DIAGNOSIS — N925 Other specified irregular menstruation: Secondary | ICD-10-CM

## 2013-05-29 DIAGNOSIS — N938 Other specified abnormal uterine and vaginal bleeding: Secondary | ICD-10-CM

## 2013-05-29 DIAGNOSIS — N949 Unspecified condition associated with female genital organs and menstrual cycle: Secondary | ICD-10-CM

## 2013-05-29 MED ORDER — NORETHINDRONE ACET-ETHINYL EST 1.5-30 MG-MCG PO TABS: 1.0000 | ORAL_TABLET | Freq: Every day | ORAL | Status: DC

## 2013-05-29 NOTE — Progress Notes (Signed)
37 y.o. Married Caucasian [id number] here for evaluation of Lomedia  initiated on February 9,2014 for cycle control only of cramping and PMS. Menses duration 4 days with small to mod flow. Patient taking medication as prescribed. Denies missed pills, headaches, nausea, DVT warning signs or symptoms,  or other changes.Cramping and PMS so much better. Complaining of spotting at 2 weeks into pack of pills. Continues for 4-5 days.Light red bleeding to brown. No cramping. No other issues.   Keeping menses calendar. Patient would like spotting to stop. No other health issues today  O: Healthy female, WD WN Affect: normal orientation X 3    A: History of dysmenorrhea with Lomedia working well, except for midcycle bleeding.  P:Discussed increase endometrial support is needed to help with midcycle spotting and bleeding. Discussed changing pills to see if change. Patient questions answered regarding change. Agreeable to try. Has aex next month will reassess then, but it may be to soon to see if resolves. Patient voiced understanding. Rx Loestrin 1.5/30 see order  20 minutes spent with patient with >50% of time spent in face to face counseling.  RV prn as above

## 2013-05-30 ENCOUNTER — Encounter: Payer: Self-pay | Admitting: Certified Nurse Midwife

## 2013-05-31 NOTE — Progress Notes (Signed)
Note reviewed, agree with plan.  Jonessa Triplett, MD  

## 2013-07-07 ENCOUNTER — Telehealth: Payer: Self-pay | Admitting: Certified Nurse Midwife

## 2013-07-07 ENCOUNTER — Encounter: Payer: Self-pay | Admitting: Certified Nurse Midwife

## 2013-07-07 ENCOUNTER — Ambulatory Visit (INDEPENDENT_AMBULATORY_CARE_PROVIDER_SITE_OTHER): Payer: BC Managed Care – PPO | Admitting: Certified Nurse Midwife

## 2013-07-07 VITALS — BP 102/64 | HR 60 | Resp 16 | Ht 66.75 in | Wt 175.0 lb

## 2013-07-07 DIAGNOSIS — N6320 Unspecified lump in the left breast, unspecified quadrant: Secondary | ICD-10-CM

## 2013-07-07 DIAGNOSIS — N938 Other specified abnormal uterine and vaginal bleeding: Secondary | ICD-10-CM

## 2013-07-07 DIAGNOSIS — N949 Unspecified condition associated with female genital organs and menstrual cycle: Secondary | ICD-10-CM

## 2013-07-07 DIAGNOSIS — Z01419 Encounter for gynecological examination (general) (routine) without abnormal findings: Secondary | ICD-10-CM

## 2013-07-07 DIAGNOSIS — N925 Other specified irregular menstruation: Secondary | ICD-10-CM

## 2013-07-07 DIAGNOSIS — N63 Unspecified lump in unspecified breast: Secondary | ICD-10-CM

## 2013-07-07 MED ORDER — NORETHINDRONE ACET-ETHINYL EST 1.5-30 MG-MCG PO TABS: 1.0000 | ORAL_TABLET | Freq: Every day | ORAL | Status: AC

## 2013-07-07 NOTE — Patient Instructions (Addendum)

## 2013-07-07 NOTE — Telephone Encounter (Signed)
Patient returning a call to amy about an appt with [facility].

## 2013-07-07 NOTE — Progress Notes (Signed)
37 y.o. [id number] Married Caucasian Fe here for annual exam.  No more spotting with periods with initiation of OCP again. Occasional headache during onset of pack, but no migraines. Happy with choice for cycle control and contraception. Sees PCP for labs(all normal per patient) and aex. Patient noticed firmer area on left breast for past year. Denies nipple discharge or skin change. No family history of breast cancer. No other health issues.  Patient will continue to keep menses calendar and advise if spotting returns while on OCP.  Patient's last menstrual period was 06/26/2013.          Sexually active: yes  The current method of family planning is OCP (estrogen/progesterone).    Exercising: no  exercise Smoker:  no  Health Maintenance: Pap: 9/13 neg HPV HR neg MMG:  none Colonoscopy: 5/12 BMD:   none TDaP:  2012 Labs: none Self breast exam: done monthly   reports that she has never smoked. She does not have any smokeless tobacco history on file. She reports that she drinks about 0.5 ounces of alcohol per week. She reports that she does not use illicit drugs.  Past Medical History  Diagnosis Date  . Seasonal allergies   . Ovarian cyst   . Colitis 2010    left colon thickened on CT, suspect ischemic colitis  . Asthma     as a child    Past Surgical History  Procedure Laterality Date  . Umbilical hernia repair      37 years old  . Colonoscopy  03/22/11    normal  . Laparoscopy  05/02/2011    Procedure: LAPAROSCOPY OPERATIVE;  Surgeon: Aram Beecham P Romine;  Location: [facility];  Service: Gynecology;  Laterality: Bilateral;  Bilateral ovaina cystectomy; and Removal of Right paratubal Cyst (not sent as a Specimen as per Dr. Tresa Res)    Current Outpatient Prescriptions  Medication Sig Dispense Refill  . loratadine (CLARITIN) 10 MG tablet Take 10 mg by mouth daily.      . Norethindrone Acetate-Ethinyl Estradiol (LOESTRIN 1.5/30, 21,) 1.5-30 MG-MCG tablet Take 1 tablet by mouth daily.  3  Package  3   No current facility-administered medications for this visit.    Family History  Problem Relation Age of Onset  . Pancreatic cancer Paternal Grandfather     ROS:  Pertinent items are noted in HPI.  Otherwise, a comprehensive ROS was negative.  Exam:   BP 102/64  Pulse 60  Resp 16  Ht 5' 6.75" (1.695 m)  Wt 175 lb (79.379 kg)  BMI 27.63 kg/m2  LMP 06/26/2013 Height: 5' 6.75" (169.5 cm)  Ht Readings from Last 3 Encounters:  07/07/13 5' 6.75" (1.695 m)  05/29/13 5' 6.25" (1.683 m)  05/02/11 5\' 7"  (1.702 m)    General appearance: alert, cooperative and appears stated age Head: Normocephalic, without obvious abnormality, atraumatic Neck: no adenopathy, supple, symmetrical, trachea midline and thyroid normal to inspection and palpation Lungs: clear to auscultation bilaterally Breasts: normal appearance, no masses or tenderness, No nipple retraction or dimpling, No nipple discharge or bleeding, No axillary or supraclavicular adenopathy, on right.Left breast no skin change, nipple discharge, but firm area noted in Left outer quadrant between 2-4 o'clock, non tender, soft, moveable. Heart: regular rate and rhythm Abdomen: soft, non-tender; no masses,  no organomegaly Extremities: extremities normal, atraumatic, no cyanosis or edema Skin: Skin color, texture, turgor normal. No rashes or lesions Lymph nodes: Cervical, supraclavicular, and axillary nodes normal. No abnormal inguinal nodes palpated Neurologic: Grossly normal  Pelvic: External genitalia:  no lesions              Urethra:  normal appearing urethra with no masses, tenderness or lesions              Bartholin's and Skene's: normal                 Vagina: normal appearing vagina with normal color and discharge, no lesions              Cervix: normal non tender              Pap taken: no Bimanual Exam:  Uterus:  normal size, contour, position, consistency, mobility, non-tender and anteflexed               Adnexa: normal adnexa and no mass, fullness, tenderness               Rectovaginal: Confirms               Anus:  normal sphincter tone, no lesions  A:  Well Woman with normal exam  Contraception OCP working well for spotting control  ? Left breast mass vs, muscle tissue  P:   Reviewed health and wellness pertinent to exam  Rx Loestrin 1.5/30 Fe see order Continue menses calendar and advise if spotting returns  Discussed need for evaluation with diagnostic mammogram/ [place]. Patient agreeable. Questions addressed.  Pap smear as per guidelines  pap smear not taken today  counseled on breast self exam, use and side effects of OCP's, adequate intake of calcium and vitamin D, diet and exercise  return annually or prn  An After Visit Summary was printed and given to the patient.

## 2013-07-07 NOTE — Telephone Encounter (Signed)
LMTCB  aa   (Appt at [facility] 07-09-13 at 9:30 am. Phone [phone] to reschedule. Address [address]. Info left on VM earlier today.)

## 2013-07-08 NOTE — Progress Notes (Signed)
Note reviewed, agree with plan.  Samanta Gal, MD  

## 2013-07-15 ENCOUNTER — Telehealth: Payer: Self-pay | Admitting: Emergency Medicine

## 2013-07-15 NOTE — Telephone Encounter (Signed)
Spoke with patient. Given message from Verner Chol CNM, patient declines at this time to come in for R breast check. Feels that it is unnecessary at this time to come for check after results of U/S. Advised patient if she changes her mind can call back for appointment or if anything changes to let us know. Patient verbalized understanding.

## 2013-12-17 ENCOUNTER — Ambulatory Visit (INDEPENDENT_AMBULATORY_CARE_PROVIDER_SITE_OTHER): Payer: BC Managed Care – PPO | Admitting: Family Medicine

## 2013-12-17 ENCOUNTER — Encounter: Payer: Self-pay | Admitting: Family Medicine

## 2013-12-17 VITALS — BP 132/78 | HR 86 | Temp 98.5°F | Ht 66.75 in | Wt 185.5 lb

## 2013-12-17 DIAGNOSIS — R109 Unspecified abdominal pain: Secondary | ICD-10-CM

## 2013-12-17 DIAGNOSIS — R5381 Other malaise: Secondary | ICD-10-CM | POA: Insufficient documentation

## 2013-12-17 DIAGNOSIS — R5383 Other fatigue: Secondary | ICD-10-CM

## 2013-12-17 DIAGNOSIS — R350 Frequency of micturition: Secondary | ICD-10-CM

## 2013-12-17 DIAGNOSIS — R1030 Lower abdominal pain, unspecified: Secondary | ICD-10-CM

## 2013-12-17 DIAGNOSIS — R197 Diarrhea, unspecified: Secondary | ICD-10-CM

## 2013-12-17 LAB — POCT URINALYSIS DIPSTICK
Bilirubin, UA: NEGATIVE
Blood, UA: NEGATIVE
Glucose, UA: NEGATIVE
Ketones, UA: NEGATIVE
LEUKOCYTES UA: NEGATIVE
NITRITE UA: NEGATIVE
PROTEIN UA: NEGATIVE
UROBILINOGEN UA: 0.2
pH, UA: 7

## 2013-12-17 NOTE — Patient Instructions (Signed)
Labs today  Keep a food diary  Also keep mood diary  Lets go from there

## 2013-12-17 NOTE — Progress Notes (Signed)
Pre visit review using our clinic review tool, if applicable. No additional management support is needed unless otherwise documented below in the visit note. 

## 2013-12-17 NOTE — Progress Notes (Signed)
Subjective:    Patient ID: Christina Briggs, female    DOB: 11/04/1975, 38 y.o.   MRN: [id number]  HPI Here for multiple symptoms  Results for orders placed in visit on 12/17/13  POCT URINALYSIS DIPSTICK      Result Value Ref Range   Color, UA yellow     Clarity, UA clear     Glucose, UA neg.     Bilirubin, UA neg.     Ketones, UA neg.     Spec Grav, UA <=1.005     Blood, UA neg.     pH, UA 7.0     Protein, UA neg.     Urobilinogen, UA 0.2     Nitrite, UA neg.     Leukocytes, UA Negative     drinks lots of water   She has "episodes" About an hour after going to bed - she wakes up feeling somewhat cold and very shaky all over  Also severe stomach cramps - and will be in the bathroom with all night - has a bm and that helps for a minute and symptoms come right back  Gets a bit panicky Only happens at night- usually within 1 hour after lying down  Happened camping once/ also once when her son was in the hospital getting surgery  ? If stress makes it worse (some times it seems to)  Has not kept a food diary  At least once she had a black tarry stool - that smells very foul  Only had blood in stool once in 2010-never again  Usually episodes happen no more than every 3 mo  Gets in a hot shower   Had colonoscopy with nl results when this all started 2010? Or 2011 Had CT and US when in ER  Tests never came back with food poisoning  Did not do bx -nothing to bx   occ wakes up with blurry vision   Has general fatigue  Not eating the best and not enough exercise  Has palpitations - occ -went to UC for that and had nl EKG/ work up   Stress level-not too bad  Married  She works part time - flexible job  She cares for the household  Never been abused   Takes OC  husb had vasectomy  Does not think she is pregnant Feels better on OC    Wt is up 10 lb with bmi of 29  Has had ovarian cysts removed since last visit Also saw a GI for problems after she had food  poisoning  [place]nl cbc - no other labs   She sees gyn annually   Patient Active Problem List   Diagnosis Date Noted  . Abdominal pain, lower 12/17/2013  . Other malaise and fatigue 12/17/2013  . Urinary frequency 12/17/2013  . Ovarian cyst 02/20/2011  . Diarrhea 02/20/2011  . Plantar warts 02/20/2011  . Routine general medical examination at a health care facility 02/09/2011   Past Medical History  Diagnosis Date  . Seasonal allergies   . Ovarian cyst   . Colitis 2010    left colon thickened on CT, suspect ischemic colitis  . Asthma     as a child   Past Surgical History  Procedure Laterality Date  . Umbilical hernia repair      38 years old  . Colonoscopy  03/22/11    normal  . Laparoscopy  05/02/2011    Procedure: LAPAROSCOPY OPERATIVE;  Surgeon: Edwena Felty Romine;  Location: [facility];  Service: Gynecology;  Laterality: Bilateral;  Bilateral ovaina cystectomy; and Removal of Right paratubal Cyst (not sent as a Specimen as per Dr. Tresa Res)   History  Substance Use Topics  . Smoking status: Never Smoker   . Smokeless tobacco: Not on file  . Alcohol Use: .5 - 1 oz/week    1-2 drink(s) per week     Comment: occasional   Family History  Problem Relation Age of Onset  . Pancreatic cancer Paternal Grandfather   . Multiple sclerosis Mother    Allergies  Allergen Reactions  . Iohexol   . Other Itching    Xray dye caused itching in throat.  . Sulfa Antibiotics   . Sulfonamide Derivatives     REACTION: rash   Current Outpatient Prescriptions on File Prior to Visit  Medication Sig Dispense Refill  . loratadine (CLARITIN) 10 MG tablet Take 10 mg by mouth daily.      . Norethindrone Acetate-Ethinyl Estradiol (LOESTRIN 1.5/30, 21,) 1.5-30 MG-MCG tablet Take 1 tablet by mouth daily.  1 Package  12   No current facility-administered medications on file prior to visit.    Review of Systems Review of Systems  Constitutional: Negative for fever,  appetite change, and unexpected weight change.  Eyes: Negative for pain and discharge or redness ENT neg for sinus pain or ST Respiratory: Negative for cough and shortness of breath.   Cardiovascular: Negative for cp or sob on exertion  Gastrointestinal: pos for nausea and episodes of abd pain with diarrhea/ cramping/ neg for dark stool .  Genitourinary: Negative for urgency and dysuria and hematuria  Skin: Negative for pallor or rash   Neurological: Negative for weakness,  numbness and headaches.  Hematological: Negative for adenopathy. Does not bruise/bleed easily.  Psychiatric/Behavioral: Negative for dysphoric mood. The patient is not sure if she is  nervous/anxious.         Objective:   Physical Exam  Constitutional: She appears well-developed and well-nourished. No distress.  overwt and well appearing   HENT:  Head: Normocephalic and atraumatic.  Right Ear: External ear normal.  Left Ear: External ear normal.  Nose: Nose normal.  Mouth/Throat: Oropharynx is clear and moist.  Eyes: Conjunctivae and EOM are normal. Pupils are equal, round, and reactive to light. Right eye exhibits no discharge. Left eye exhibits no discharge. No scleral icterus.  Neck: Normal range of motion. Neck supple. No JVD present. No thyromegaly present.  Cardiovascular: Normal rate, regular rhythm, normal heart sounds and intact distal pulses.  Exam reveals no gallop.   Pulmonary/Chest: Effort normal and breath sounds normal. No respiratory distress. She has no wheezes. She has no rales.  Abdominal: Soft. Bowel sounds are normal. She exhibits no distension and no mass. There is no tenderness. There is no rebound and no guarding.  Musculoskeletal: She exhibits no edema and no tenderness.  Lymphadenopathy:    She has no cervical adenopathy.  Neurological: She is alert. She has normal reflexes. No cranial nerve deficit. She exhibits normal muscle tone. Coordination normal.  Skin: Skin is warm and dry. No  rash noted. No erythema. No pallor.  Psychiatric: Her behavior is normal. Thought content normal. Her mood appears anxious. Her affect is not blunt and not labile. Her speech is rapid and/or pressured. Her speech is not delayed and not tangential. Cognition and memory are normal.  Pt states that she has always had very rapid speech  Assessment & Plan:

## 2013-12-18 LAB — COMPREHENSIVE METABOLIC PANEL
ALBUMIN: 4.4 g/dL (ref 3.5–5.2)
ALK PHOS: 51 U/L (ref 39–117)
ALT: 11 U/L (ref 0–35)
AST: 14 U/L (ref 0–37)
BUN: 10 mg/dL (ref 6–23)
CO2: 31 mEq/L (ref 19–32)
CREATININE: 0.71 mg/dL (ref 0.50–1.10)
Calcium: 9.7 mg/dL (ref 8.4–10.5)
Chloride: 103 mEq/L (ref 96–112)
Glucose, Bld: 80 mg/dL (ref 70–99)
POTASSIUM: 4 meq/L (ref 3.5–5.3)
Sodium: 140 mEq/L (ref 135–145)
Total Bilirubin: 0.2 mg/dL (ref 0.2–1.2)
Total Protein: 7.2 g/dL (ref 6.0–8.3)

## 2013-12-18 LAB — CBC WITH DIFFERENTIAL/PLATELET
BASOS ABS: 0 10*3/uL (ref 0.0–0.1)
BASOS PCT: 0 % (ref 0–1)
Eosinophils Absolute: 0.1 10*3/uL (ref 0.0–0.7)
Eosinophils Relative: 2 % (ref 0–5)
HEMATOCRIT: 38.4 % (ref 36.0–46.0)
Hemoglobin: 13.1 g/dL (ref 12.0–15.0)
Lymphocytes Relative: 34 % (ref 12–46)
Lymphs Abs: 1.8 10*3/uL (ref 0.7–4.0)
MCH: 30.4 pg (ref 26.0–34.0)
MCHC: 34.1 g/dL (ref 30.0–36.0)
MCV: 89.1 fL (ref 78.0–100.0)
MONO ABS: 0.4 10*3/uL (ref 0.1–1.0)
Monocytes Relative: 7 % (ref 3–12)
NEUTROS ABS: 3 10*3/uL (ref 1.7–7.7)
Neutrophils Relative %: 57 % (ref 43–77)
Platelets: 402 10*3/uL — ABNORMAL HIGH (ref 150–400)
RBC: 4.31 MIL/uL (ref 3.87–5.11)
RDW: 13.5 % (ref 11.5–15.5)
WBC: 5.3 10*3/uL (ref 4.0–10.5)

## 2013-12-18 LAB — VITAMIN B12: Vitamin B-12: 295 pg/mL (ref 211–911)

## 2013-12-18 LAB — TSH: TSH: 1.733 u[IU]/mL (ref 0.350–4.500)

## 2013-12-18 NOTE — Assessment & Plan Note (Signed)
Episodes of severe diarrhea/ cramping -always at night  Neg colonosc- but never went back to finish GI w/u in the past  Lab today Consider GI referral  Enc her to keep a diary of symptoms and diet and also mood/ stressors

## 2013-12-18 NOTE — Assessment & Plan Note (Signed)
With many other physical symptoms  Disc poss of anxiety/ stressors Lab today Disc sleep patterns/ diet/ exercise as well

## 2013-12-18 NOTE — Assessment & Plan Note (Signed)
See eval for diarrhea- episodes are discreet and at night Lab today Differential is wide incl ibs/ inflam bd/gastritis/ infx/panic disorder/ food allergy or intol  Will likely need to complete the GI work up she started in the past

## 2013-12-18 NOTE — Assessment & Plan Note (Signed)
Neg ua  ? Overactive bladder  Lab today

## 2013-12-22 ENCOUNTER — Encounter: Payer: Self-pay | Admitting: Gastroenterology

## 2013-12-22 ENCOUNTER — Telehealth: Payer: Self-pay | Admitting: Family Medicine

## 2013-12-22 DIAGNOSIS — R1030 Lower abdominal pain, unspecified: Secondary | ICD-10-CM

## 2013-12-22 DIAGNOSIS — R197 Diarrhea, unspecified: Secondary | ICD-10-CM

## 2013-12-22 NOTE — Telephone Encounter (Signed)
Message copied by Judy PimpleWER, MARNE A on Mon Dec 22, 2013  7:51 AM ------      Message from: Shon MilletWATLINGTON, SHAPALE M      Created: Fri Dec 19, 2013 12:56 PM       Pt notified of labs and to start taking a vit b complex. Pt does want to see GI so please put referral in, pt said if possible [place] and if possible have an appt scheduled in the week of April (7-10th), because she is off that week ------

## 2014-01-28 ENCOUNTER — Ambulatory Visit: Payer: BC Managed Care – PPO | Admitting: Gastroenterology

## 2014-07-08 ENCOUNTER — Ambulatory Visit: Payer: BC Managed Care – PPO | Admitting: Certified Nurse Midwife

## 2014-07-24 ENCOUNTER — Other Ambulatory Visit: Payer: Self-pay | Admitting: Obstetrics and Gynecology

## 2014-07-27 LAB — CYTOLOGY - PAP

## 2014-08-24 ENCOUNTER — Encounter: Payer: Self-pay | Admitting: Family Medicine
